# Patient Record
Sex: Female | Born: 1945 | Race: White | Hispanic: No | Marital: Married | Smoking: Never smoker
Health system: Southern US, Community
[De-identification: ages and names within clinical notes are randomized; demographics above are authoritative.]

## PROBLEM LIST (undated history)

## (undated) DIAGNOSIS — E7800 Pure hypercholesterolemia, unspecified: Secondary | ICD-10-CM

## (undated) DIAGNOSIS — F419 Anxiety disorder, unspecified: Secondary | ICD-10-CM

## (undated) DIAGNOSIS — I10 Essential (primary) hypertension: Secondary | ICD-10-CM

## (undated) DIAGNOSIS — C4490 Unspecified malignant neoplasm of skin, unspecified: Secondary | ICD-10-CM

## (undated) HISTORY — PX: NECK SURGERY: SHX720

## (undated) HISTORY — PX: SYNOVIAL CYST EXCISION: SUR507

---

## 2011-08-11 ENCOUNTER — Ambulatory Visit: Payer: Self-pay

## 2012-08-14 ENCOUNTER — Ambulatory Visit: Payer: Self-pay | Admitting: *Deleted

## 2013-08-15 ENCOUNTER — Ambulatory Visit: Payer: Self-pay | Admitting: *Deleted

## 2014-10-15 ENCOUNTER — Ambulatory Visit
Admission: RE | Admit: 2014-10-15 | Discharge: 2014-10-15 | Disposition: A | Discharge status: Home / Self Care | Payer: Medicare Other | Source: Ambulatory Visit | Attending: Internal Medicine | Admitting: Internal Medicine

## 2014-10-15 ENCOUNTER — Other Ambulatory Visit: Payer: Self-pay

## 2014-10-15 DIAGNOSIS — Z1231 Encounter for screening mammogram for malignant neoplasm of breast: Secondary | ICD-10-CM | POA: Diagnosis not present

## 2014-10-15 DIAGNOSIS — Z1239 Encounter for other screening for malignant neoplasm of breast: Secondary | ICD-10-CM

## 2014-10-15 HISTORY — DX: Unspecified malignant neoplasm of skin, unspecified: C44.90

## 2015-11-13 ENCOUNTER — Other Ambulatory Visit: Payer: Self-pay | Admitting: *Deleted

## 2015-11-13 DIAGNOSIS — Z1231 Encounter for screening mammogram for malignant neoplasm of breast: Secondary | ICD-10-CM

## 2015-11-17 ENCOUNTER — Ambulatory Visit
Admission: RE | Admit: 2015-11-17 | Discharge: 2015-11-17 | Disposition: A | Discharge status: Home / Self Care | Payer: Medicare Other | Source: Ambulatory Visit | Attending: *Deleted | Admitting: *Deleted

## 2015-11-17 DIAGNOSIS — Z1231 Encounter for screening mammogram for malignant neoplasm of breast: Secondary | ICD-10-CM | POA: Insufficient documentation

## 2016-10-21 ENCOUNTER — Other Ambulatory Visit: Payer: Self-pay | Admitting: *Deleted

## 2017-01-19 ENCOUNTER — Other Ambulatory Visit: Payer: Self-pay | Admitting: *Deleted

## 2017-01-19 DIAGNOSIS — Z1231 Encounter for screening mammogram for malignant neoplasm of breast: Secondary | ICD-10-CM

## 2017-01-31 ENCOUNTER — Ambulatory Visit
Admission: RE | Admit: 2017-01-31 | Discharge: 2017-01-31 | Disposition: A | Discharge status: Home / Self Care | Payer: Medicare Other | Source: Ambulatory Visit | Attending: *Deleted | Admitting: *Deleted

## 2017-01-31 DIAGNOSIS — Z1231 Encounter for screening mammogram for malignant neoplasm of breast: Secondary | ICD-10-CM | POA: Insufficient documentation

## 2018-01-24 ENCOUNTER — Other Ambulatory Visit: Payer: Self-pay | Admitting: *Deleted

## 2018-01-24 DIAGNOSIS — Z1231 Encounter for screening mammogram for malignant neoplasm of breast: Secondary | ICD-10-CM

## 2018-03-06 ENCOUNTER — Ambulatory Visit
Admission: RE | Admit: 2018-03-06 | Discharge: 2018-03-06 | Disposition: A | Discharge status: Home / Self Care | Payer: Medicare Other | Source: Ambulatory Visit | Attending: *Deleted | Admitting: *Deleted

## 2018-03-06 DIAGNOSIS — Z1231 Encounter for screening mammogram for malignant neoplasm of breast: Secondary | ICD-10-CM | POA: Insufficient documentation

## 2019-04-19 ENCOUNTER — Other Ambulatory Visit: Payer: Self-pay | Admitting: *Deleted

## 2019-04-19 DIAGNOSIS — Z1231 Encounter for screening mammogram for malignant neoplasm of breast: Secondary | ICD-10-CM

## 2019-07-02 ENCOUNTER — Other Ambulatory Visit: Payer: Self-pay

## 2019-07-02 ENCOUNTER — Ambulatory Visit
Admission: RE | Admit: 2019-07-02 | Discharge: 2019-07-02 | Disposition: A | Discharge status: Home / Self Care | Payer: Medicare PPO | Source: Ambulatory Visit | Attending: *Deleted | Admitting: *Deleted

## 2019-07-02 DIAGNOSIS — Z1231 Encounter for screening mammogram for malignant neoplasm of breast: Secondary | ICD-10-CM | POA: Diagnosis present

## 2020-11-25 ENCOUNTER — Other Ambulatory Visit: Payer: Self-pay | Admitting: *Deleted

## 2020-11-25 DIAGNOSIS — Z1231 Encounter for screening mammogram for malignant neoplasm of breast: Secondary | ICD-10-CM

## 2020-11-25 DIAGNOSIS — Z780 Asymptomatic menopausal state: Secondary | ICD-10-CM

## 2021-03-17 ENCOUNTER — Ambulatory Visit
Admission: RE | Admit: 2021-03-17 | Discharge: 2021-03-17 | Disposition: A | Discharge status: Home / Self Care | Payer: Medicare PPO | Source: Ambulatory Visit | Attending: *Deleted | Admitting: *Deleted

## 2021-03-17 ENCOUNTER — Other Ambulatory Visit: Payer: Self-pay

## 2021-03-17 DIAGNOSIS — M85852 Other specified disorders of bone density and structure, left thigh: Secondary | ICD-10-CM | POA: Diagnosis not present

## 2021-03-17 DIAGNOSIS — Z780 Asymptomatic menopausal state: Secondary | ICD-10-CM

## 2021-03-17 DIAGNOSIS — Z1231 Encounter for screening mammogram for malignant neoplasm of breast: Secondary | ICD-10-CM | POA: Insufficient documentation

## 2021-03-17 DIAGNOSIS — Z13820 Encounter for screening for osteoporosis: Secondary | ICD-10-CM | POA: Diagnosis not present

## 2021-03-17 DIAGNOSIS — Z85828 Personal history of other malignant neoplasm of skin: Secondary | ICD-10-CM | POA: Insufficient documentation

## 2021-05-07 ENCOUNTER — Other Ambulatory Visit: Payer: Self-pay

## 2021-05-07 ENCOUNTER — Ambulatory Visit
Admission: EM | Admit: 2021-05-07 | Discharge: 2021-05-07 | Disposition: A | Discharge status: Home / Self Care | Payer: Medicare PPO | Attending: Internal Medicine | Admitting: Internal Medicine

## 2021-05-07 DIAGNOSIS — Z20822 Contact with and (suspected) exposure to covid-19: Secondary | ICD-10-CM | POA: Diagnosis not present

## 2021-05-07 DIAGNOSIS — I10 Essential (primary) hypertension: Secondary | ICD-10-CM | POA: Insufficient documentation

## 2021-05-07 DIAGNOSIS — E785 Hyperlipidemia, unspecified: Secondary | ICD-10-CM | POA: Insufficient documentation

## 2021-05-07 DIAGNOSIS — J101 Influenza due to other identified influenza virus with other respiratory manifestations: Secondary | ICD-10-CM | POA: Diagnosis present

## 2021-05-07 DIAGNOSIS — R051 Acute cough: Secondary | ICD-10-CM

## 2021-05-07 DIAGNOSIS — R509 Fever, unspecified: Secondary | ICD-10-CM

## 2021-05-07 HISTORY — DX: Essential (primary) hypertension: I10

## 2021-05-07 HISTORY — DX: Pure hypercholesterolemia, unspecified: E78.00

## 2021-05-07 HISTORY — DX: Anxiety disorder, unspecified: F41.9

## 2021-05-07 LAB — RESP PANEL BY RT-PCR (FLU A&B, COVID) ARPGX2
Influenza A by PCR: POSITIVE — AB
Influenza B by PCR: NEGATIVE
SARS Coronavirus 2 by RT PCR: NEGATIVE

## 2021-05-07 MED ORDER — OSELTAMIVIR PHOSPHATE 75 MG PO CAPS: 75.0000 mg | ORAL_CAPSULE | Freq: Two times a day (BID) | ORAL | 0 refills | Status: AC

## 2021-05-07 NOTE — ED Provider Notes (Signed)
[facility]    CSN: [id number] Arrival date & time: 05/07/21  1802      History   Chief Complaint Chief Complaint  Patient presents with   Fever   Cough    HPI Linda Crane is a 75 y.o. female presenting with her husband for onset of fever up to 103 degrees yesterday evening.  Also reports cough but says the cough has been present for about a week with postnasal drainage.  She reports she has those symptoms due to allergies every year and that is not of the normal but it is abnormal for her to have a fever.  Admits to some fatigue but denies any body aches.  Mild sore throat.  No chest pain or breathing difficulty.  No vomiting or diarrhea.  No sick contacts.  Patient did post Thanksgiving and says that her fever came on after she served everyone dinner.  She has taken Tylenol over-the-counter for symptoms as well as an expectorant.  Patient's past medical history significant for hypertension and hyperlipidemia.  No other complaints.  HPI  Past Medical History:  Diagnosis Date   Anxiety    High cholesterol    Hypertension    Skin cancer     There are no problems to display for this patient.   Past Surgical History:  Procedure Laterality Date   NECK SURGERY     SYNOVIAL CYST EXCISION      OB History   No obstetric history on file.      Home Medications    Prior to Admission medications   Medication Sig Start Date End Date Taking? Authorizing Provider  atorvastatin (LIPITOR) 40 MG tablet Take 1 tablet by mouth at bedtime. 02/24/13  Yes [provider]  benazepril (LOTENSIN) 20 MG tablet Take 1 tablet by mouth daily. 05/10/13  Yes [provider]  oseltamivir (TAMIFLU) 75 MG capsule Take 1 capsule (75 mg total) by mouth 2 (two) times daily for 5 days. 05/07/21 05/12/21 Yes Danton Clap, PA-C  aspirin 81 MG EC tablet Take by mouth.    [provider]  BIOTIN PO Take by mouth.    [provider]  CALCIUM PO Take 1  tablet by mouth 2 (two) times daily.    [provider]  Cholecalciferol 25 MCG (1000 UT) tablet Take by mouth.    [provider]  diazepam (VALIUM) 5 MG tablet Take 5 mg by mouth 2 (two) times daily as needed. 12/17/20   [provider]  metoprolol succinate (TOPROL-XL) 50 MG 24 hr tablet Take 50 mg by mouth daily. 04/27/21   [provider]  Multiple Vitamin (MULTI-VITAMIN) tablet Take 1 tablet by mouth daily.    [provider]  niacin 500 MG tablet Take by mouth.    [provider]  sertraline (ZOLOFT) 100 MG tablet Take 100 mg by mouth daily. 02/20/21   [provider]  triamterene-hydrochlorothiazide (MAXZIDE-25) 37.5-25 MG tablet Take 1 tablet by mouth daily. 02/11/21   [provider]    Family History Family History  Problem Relation Age of Onset   Breast cancer Neg Hx     Social History Social History   Tobacco Use   Smoking status: Never   Smokeless tobacco: Never  Vaping Use   Vaping Use: Never used  Substance Use Topics   Alcohol use: Yes    Comment: social drinker   Drug use: Never     Allergies   Sulfa antibiotics, Penicillins,  and Latex   Review of Systems Review of Systems  Constitutional:  Positive for fatigue and fever. Negative for chills and diaphoresis.  HENT:  Positive for congestion and postnasal drip. Negative for ear pain, rhinorrhea, sinus pressure, sinus pain and sore throat.   Respiratory:  Positive for cough. Negative for shortness of breath and wheezing.   Cardiovascular:  Negative for chest pain.  Gastrointestinal:  Negative for abdominal pain, nausea and vomiting.  Musculoskeletal:  Negative for arthralgias and myalgias.  Skin:  Negative for rash.  Neurological:  Negative for weakness and headaches.  Hematological:  Negative for adenopathy.    Physical Exam Triage Vital Signs ED Triage Vitals [05/07/21 1827]  Enc Vitals Group     BP (!) 142/117     Pulse Rate 81      Resp 16     Temp 99.4 F (37.4 C)     Temp Source Oral     SpO2 98 %     Weight      Height      Head Circumference      Peak Flow      Pain Score      Pain Loc      Pain Edu?      Excl. in [place]?    No data found.  Updated Vital Signs BP (!) 142/117 (BP Location: Right Arm)   Pulse 81   Temp 99.4 F (37.4 C) (Oral)   Resp 16   SpO2 98%      Physical Exam Vitals and nursing note reviewed.  Constitutional:      General: She is not in acute distress.    Appearance: Normal appearance. She is not ill-appearing or toxic-appearing.  HENT:     Head: Normocephalic and atraumatic.     Nose: Congestion present.     Mouth/Throat:     Mouth: Mucous membranes are moist.     Pharynx: Oropharynx is clear. Posterior oropharyngeal erythema (mild with clear PND) present.  Eyes:     General: No scleral icterus.       Right eye: No discharge.        Left eye: No discharge.     Conjunctiva/sclera: Conjunctivae normal.  Cardiovascular:     Rate and Rhythm: Normal rate and regular rhythm.     Heart sounds: Normal heart sounds.  Pulmonary:     Effort: Pulmonary effort is normal. No respiratory distress.     Breath sounds: Normal breath sounds.  Musculoskeletal:     Cervical back: Neck supple.  Skin:    General: Skin is dry.  Neurological:     General: No focal deficit present.     Mental Status: She is alert. Mental status is at baseline.     Motor: No weakness.     Gait: Gait normal.  Psychiatric:        Mood and Affect: Mood normal.        Behavior: Behavior normal.        Thought Content: Thought content normal.     UC Treatments / Results  Labs (all labs ordered are listed, but only abnormal results are displayed) Labs Reviewed  RESP PANEL BY RT-PCR (FLU A&B, COVID) ARPGX2 - Abnormal; Notable for the following components:      Result Value   Influenza A by PCR POSITIVE (*)    All other components within normal limits    EKG   Radiology No results  found.  Procedures Procedures (including critical care time)  Medications Ordered in UC Medications - No data to display  Initial Impression / Assessment and Plan / UC Course  I have reviewed the triage vital signs and the nursing notes.  Pertinent labs & imaging results that were available during my care of the patient were reviewed by me and considered in my medical decision making (see chart for details).   75 year old female presenting for fever starting yesterday.  Cough and postnasal drainage for the past week or so.  Vitals are stable but blood pressure is a bit elevated at 142/117.  Temp currently 99.4 degrees but has been taking Tylenol.  Exam significant for nasal congestion and mild posterior pharyngeal erythema with clear postnasal drainage.  Chest clear to auscultation heart regular rate and rhythm.  PCR respiratory panel obtained and patient positive for influenza A.  Discussed results with patient.  Suspect her flu symptoms began yesterday.  She would like to be treated with Tamiflu so I printed the prescription for her.  Also advised her to continue the expectorant and rest as well as fluids and Tylenol for fever.  Thoroughly reviewed return and ED precautions with patient.   Final Clinical Impressions(s) / UC Diagnoses   Final diagnoses:  Influenza A  Acute cough  Fever, unspecified     Discharge Instructions      -You have the flu.  I printed a prescription for Tamiflu.  Call around to the pharmacies and see who has it and pick it up. - Treat your symptoms with the over-the-counter expectorants, Tylenol for fever, rest and fluids. - If you have any chest pain or breathing difficulty or feeling weak you should be seen again. - Most people feel better from the flu within a week or so.     ED Prescriptions     Medication Sig Dispense Auth. Provider   oseltamivir (TAMIFLU) 75 MG capsule Take 1 capsule (75 mg total) by mouth 2 (two) times daily for 5 days. 10  capsule Danton Clap, PA-C      PDMP not reviewed this encounter.   Danton Clap, PA-C 05/07/21 1928

## 2021-05-07 NOTE — Discharge Instructions (Addendum)
-  You have the flu.  I printed a prescription for Tamiflu.  Call around to the pharmacies and see who has it and pick it up. - Treat your symptoms with the over-the-counter expectorants, Tylenol for fever, rest and fluids. - If you have any chest pain or breathing difficulty or feeling weak you should be seen again. - Most people feel better from the flu within a week or so.

## 2021-05-07 NOTE — ED Triage Notes (Signed)
Patient presents to Urgent Care with complaints of fever since yesterday. Cough x 3 weeks. Treating symptoms with tylenol.

## 2022-03-03 ENCOUNTER — Other Ambulatory Visit: Payer: Self-pay | Admitting: *Deleted

## 2022-03-03 DIAGNOSIS — Z1231 Encounter for screening mammogram for malignant neoplasm of breast: Secondary | ICD-10-CM

## 2022-03-22 ENCOUNTER — Ambulatory Visit
Admission: RE | Admit: 2022-03-22 | Discharge: 2022-03-22 | Disposition: A | Discharge status: Home / Self Care | Payer: Medicare PPO | Source: Ambulatory Visit | Attending: *Deleted | Admitting: *Deleted

## 2022-03-22 DIAGNOSIS — Z1231 Encounter for screening mammogram for malignant neoplasm of breast: Secondary | ICD-10-CM | POA: Insufficient documentation

## 2023-03-13 ENCOUNTER — Other Ambulatory Visit: Payer: Self-pay | Admitting: *Deleted

## 2023-03-13 DIAGNOSIS — Z1231 Encounter for screening mammogram for malignant neoplasm of breast: Secondary | ICD-10-CM

## 2023-03-28 ENCOUNTER — Ambulatory Visit
Admission: RE | Admit: 2023-03-28 | Discharge: 2023-03-28 | Disposition: A | Discharge status: Home / Self Care | Payer: Medicare PPO | Source: Ambulatory Visit | Attending: *Deleted | Admitting: *Deleted

## 2023-03-28 DIAGNOSIS — Z1231 Encounter for screening mammogram for malignant neoplasm of breast: Secondary | ICD-10-CM | POA: Insufficient documentation

## 2023-06-22 IMAGING — MG MM DIGITAL SCREENING BILAT W/ TOMO AND CAD
6 of 10 series · 6 of 30 positions shown · non-contrast
Comparison: Previous exam(s).

CLINICAL DATA: Screening.

EXAM:
DIGITAL SCREENING BILATERAL MAMMOGRAM WITH TOMOSYNTHESIS AND CAD
TECHNIQUE: Bilateral screening digital craniocaudal and mediolateral oblique
mammograms were obtained. Bilateral screening digital breast
tomosynthesis was performed. The images were evaluated with
computer-aided detection.

[R CC synth-2D]
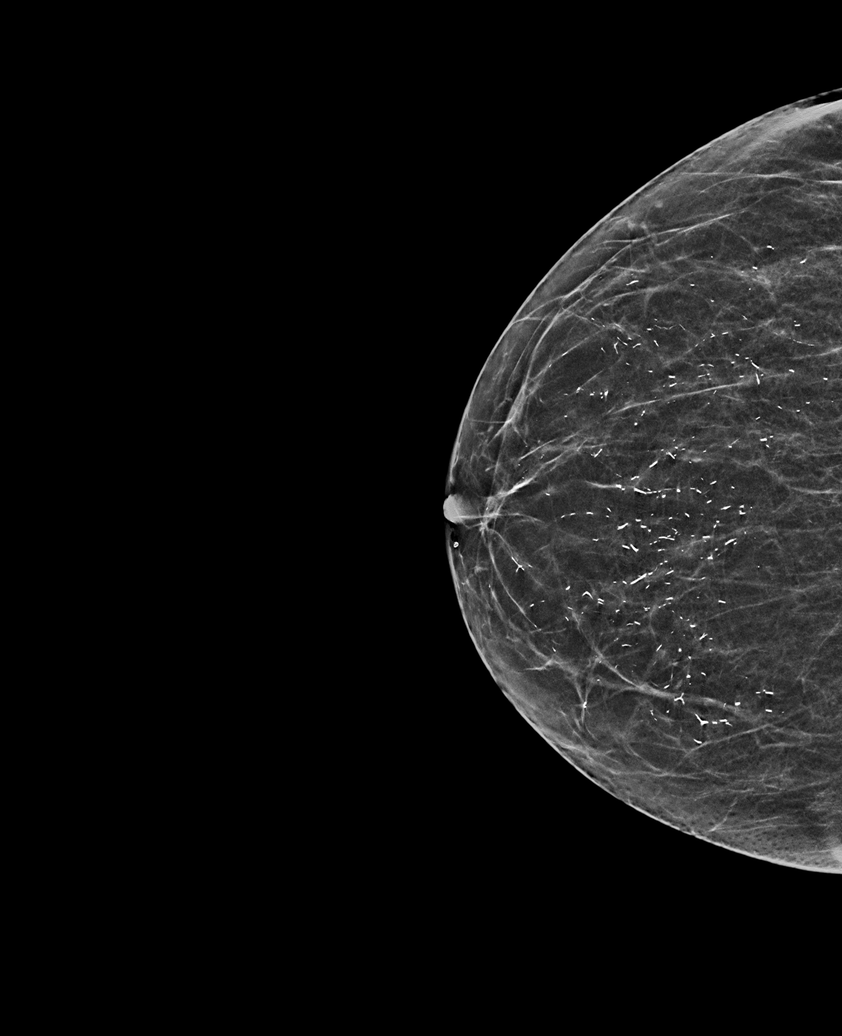

[R MLO synth-2D (1 of 2)]
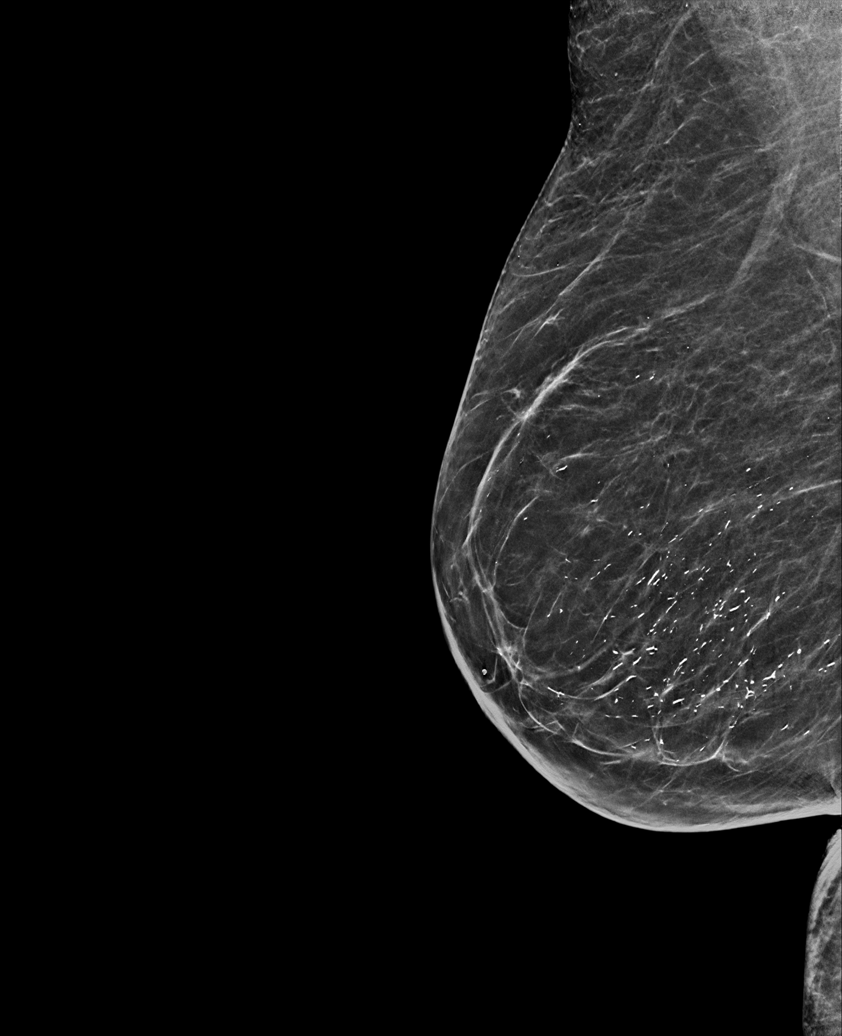

[R MLO synth-2D (2 of 2)]
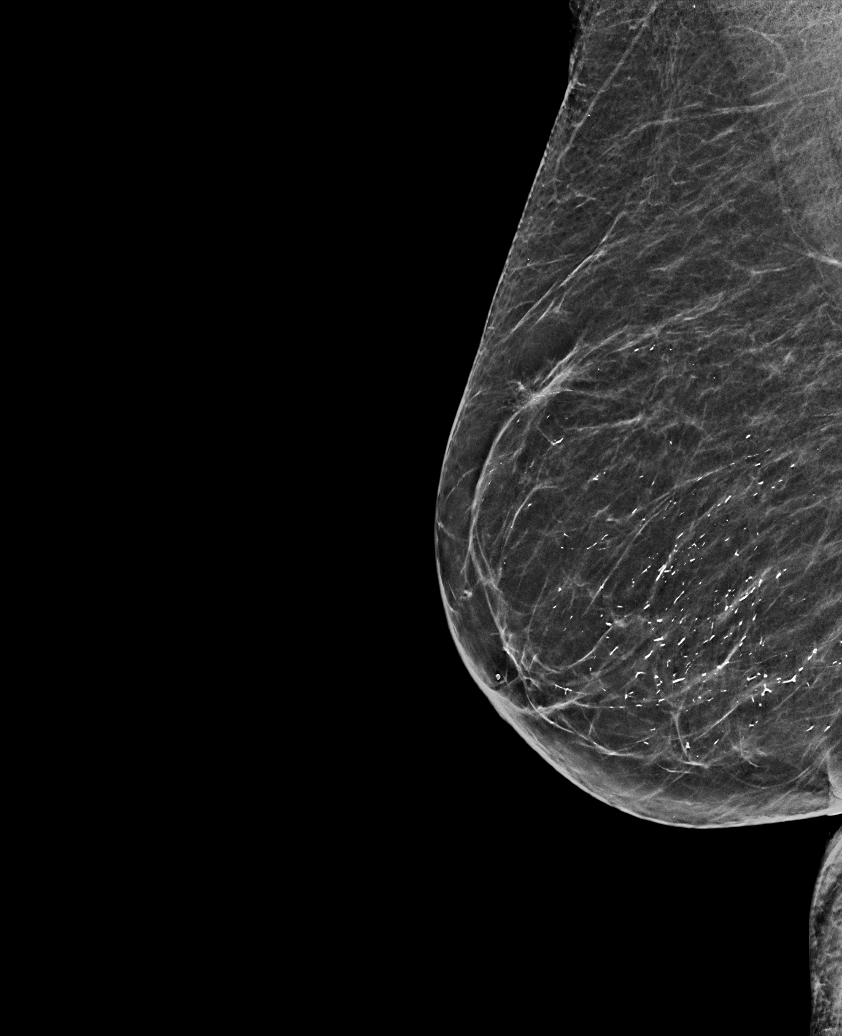

[L CC synth-2D]
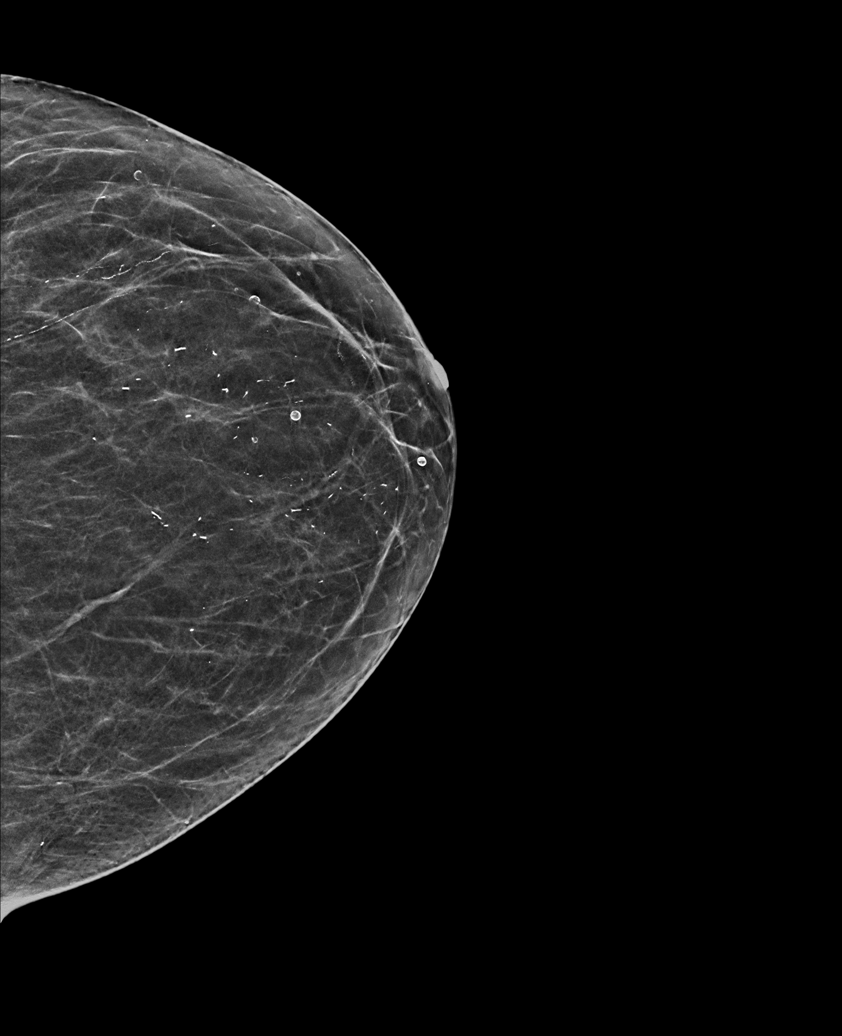

[L MLO synth-2D]
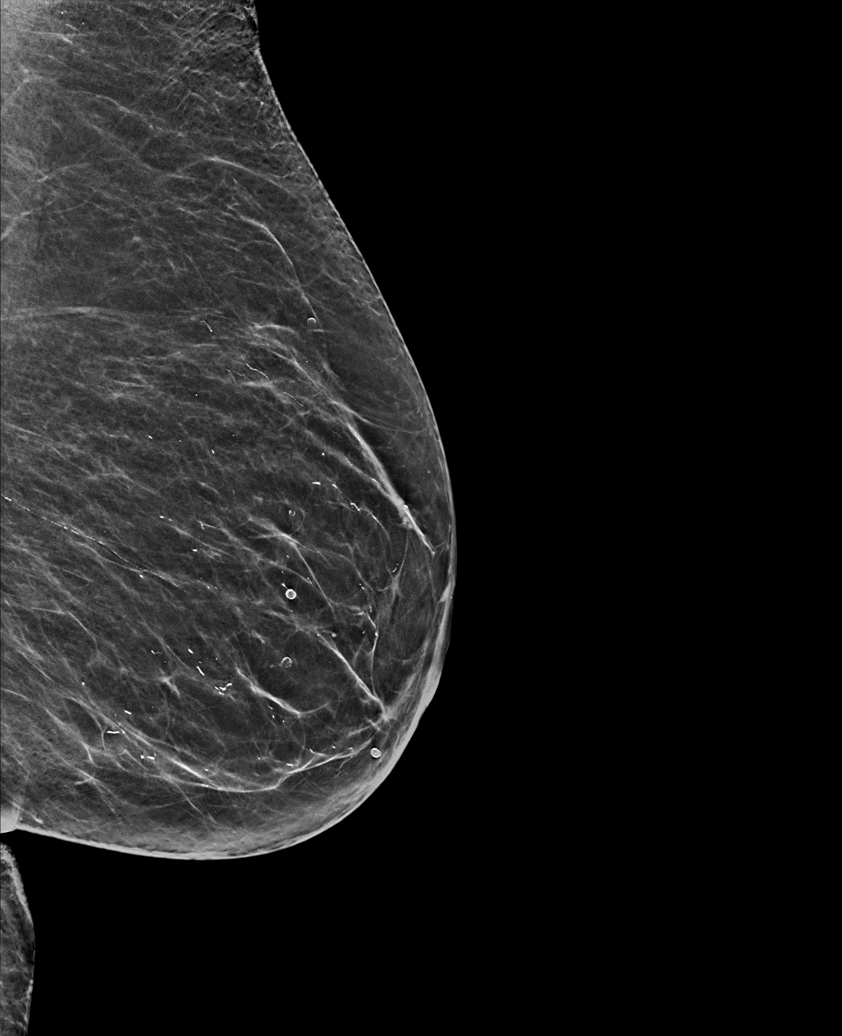

[R CC tomo · tomo slice 25/50.0]
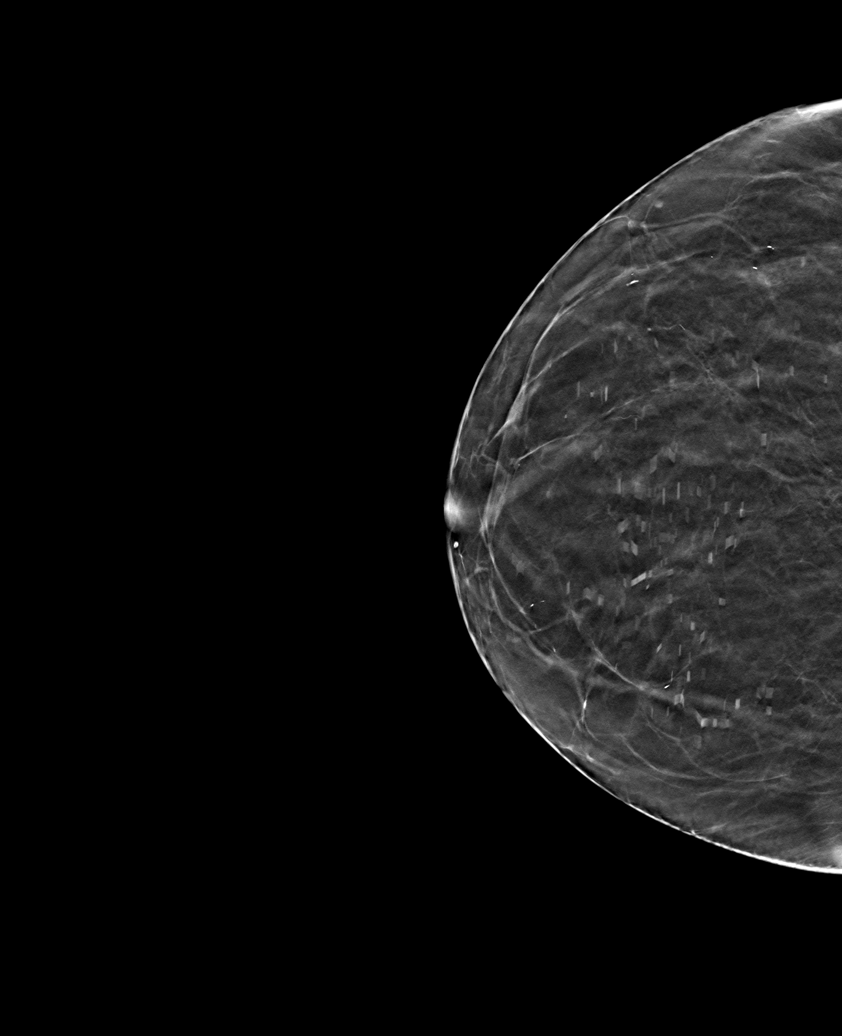

[6 of 30 positions shown; findings below may reference images not displayed]

ACR Breast Density Category b: There are scattered areas of
fibroglandular density.
FINDINGS: There are no findings suspicious for malignancy.
IMPRESSION: No mammographic evidence of malignancy. A result letter of this
screening mammogram will be mailed directly to the patient.

RECOMMENDATION:
Screening mammogram in one year. (Code:51-O-LD2)

BI-RADS CATEGORY  1: Negative.

## 2023-09-14 ENCOUNTER — Encounter: Payer: Self-pay | Admitting: *Deleted

## 2023-09-14 ENCOUNTER — Ambulatory Visit
Admission: RE | Admit: 2023-09-14 | Discharge: 2023-09-14 | Disposition: A | Discharge status: Home / Self Care | Attending: *Deleted | Admitting: *Deleted

## 2023-09-14 DIAGNOSIS — M25561 Pain in right knee: Secondary | ICD-10-CM

## 2023-09-15 ENCOUNTER — Other Ambulatory Visit: Payer: Self-pay | Admitting: *Deleted

## 2023-09-15 DIAGNOSIS — Z1231 Encounter for screening mammogram for malignant neoplasm of breast: Secondary | ICD-10-CM

## 2023-09-19 ENCOUNTER — Ambulatory Visit
Admission: RE | Admit: 2023-09-19 | Discharge: 2023-09-19 | Disposition: A | Discharge status: Home / Self Care | Source: Ambulatory Visit | Attending: *Deleted | Admitting: *Deleted

## 2023-09-19 ENCOUNTER — Other Ambulatory Visit: Payer: Self-pay | Admitting: *Deleted

## 2023-09-19 ENCOUNTER — Ambulatory Visit
Admission: RE | Admit: 2023-09-19 | Discharge: 2023-09-19 | Disposition: A | Discharge status: Home / Self Care | Attending: *Deleted | Admitting: *Deleted

## 2023-09-19 DIAGNOSIS — G8929 Other chronic pain: Secondary | ICD-10-CM

## 2023-09-19 DIAGNOSIS — M25562 Pain in left knee: Secondary | ICD-10-CM

## 2023-09-19 DIAGNOSIS — M25561 Pain in right knee: Secondary | ICD-10-CM

## 2024-03-28 ENCOUNTER — Ambulatory Visit
Admission: RE | Admit: 2024-03-28 | Discharge: 2024-03-28 | Disposition: A | Discharge status: Home / Self Care | Source: Ambulatory Visit | Attending: *Deleted | Admitting: *Deleted

## 2024-03-28 DIAGNOSIS — Z1231 Encounter for screening mammogram for malignant neoplasm of breast: Secondary | ICD-10-CM | POA: Diagnosis present
